# Patient Record
Sex: Female | Born: 1960
Health system: Southern US, Community
[De-identification: ages and names within clinical notes are randomized; demographics above are authoritative.]

## PROBLEM LIST (undated history)

## (undated) HISTORY — PX: BREAST BIOPSY: SHX20

## (undated) HISTORY — PX: AUGMENTATION MAMMAPLASTY: SUR837

---

## 1999-12-25 ENCOUNTER — Other Ambulatory Visit: Admission: RE | Admit: 1999-12-25 | Discharge: 1999-12-25 | Payer: Self-pay | Admitting: Obstetrics and Gynecology

## 1999-12-25 ENCOUNTER — Encounter (INDEPENDENT_AMBULATORY_CARE_PROVIDER_SITE_OTHER): Payer: Self-pay

## 2002-03-03 ENCOUNTER — Other Ambulatory Visit: Admission: RE | Admit: 2002-03-03 | Discharge: 2002-03-03 | Payer: Self-pay | Admitting: Family Medicine

## 2002-10-20 ENCOUNTER — Encounter: Payer: Self-pay | Admitting: Family Medicine

## 2002-10-20 ENCOUNTER — Encounter: Admission: RE | Admit: 2002-10-20 | Discharge: 2002-10-20 | Payer: Self-pay | Admitting: Family Medicine

## 2004-03-15 ENCOUNTER — Encounter: Admission: RE | Admit: 2004-03-15 | Discharge: 2004-03-15 | Payer: Self-pay | Admitting: Family Medicine

## 2004-09-28 ENCOUNTER — Other Ambulatory Visit: Admission: RE | Admit: 2004-09-28 | Discharge: 2004-09-28 | Payer: Self-pay | Admitting: Family Medicine

## 2005-05-15 ENCOUNTER — Encounter: Admission: RE | Admit: 2005-05-15 | Discharge: 2005-05-15 | Payer: Self-pay | Admitting: Family Medicine

## 2006-05-29 ENCOUNTER — Encounter: Admission: RE | Admit: 2006-05-29 | Discharge: 2006-05-29 | Payer: Self-pay | Admitting: Family Medicine

## 2007-09-03 ENCOUNTER — Encounter: Admission: RE | Admit: 2007-09-03 | Discharge: 2007-09-03 | Payer: Self-pay | Admitting: Family Medicine

## 2007-10-16 ENCOUNTER — Encounter: Admission: RE | Admit: 2007-10-16 | Discharge: 2007-10-16 | Payer: Self-pay | Admitting: Family Medicine

## 2008-10-13 ENCOUNTER — Encounter: Admission: RE | Admit: 2008-10-13 | Discharge: 2008-10-13 | Payer: Self-pay | Admitting: Family Medicine

## 2008-11-29 ENCOUNTER — Other Ambulatory Visit: Admission: RE | Admit: 2008-11-29 | Discharge: 2008-11-29 | Payer: Self-pay | Admitting: Family Medicine

## 2009-11-08 ENCOUNTER — Encounter: Admission: RE | Admit: 2009-11-08 | Discharge: 2009-11-08 | Payer: Self-pay | Admitting: Family Medicine

## 2009-12-13 ENCOUNTER — Other Ambulatory Visit: Admission: RE | Admit: 2009-12-13 | Discharge: 2009-12-13 | Payer: Self-pay | Admitting: Family Medicine

## 2010-12-05 ENCOUNTER — Other Ambulatory Visit: Payer: Self-pay | Admitting: Family Medicine

## 2010-12-05 DIAGNOSIS — Z1231 Encounter for screening mammogram for malignant neoplasm of breast: Secondary | ICD-10-CM

## 2010-12-14 ENCOUNTER — Ambulatory Visit
Admission: RE | Admit: 2010-12-14 | Discharge: 2010-12-14 | Disposition: A | Discharge status: Home / Self Care | Payer: BC Managed Care – PPO | Source: Ambulatory Visit | Attending: Family Medicine | Admitting: Family Medicine

## 2010-12-14 DIAGNOSIS — Z1231 Encounter for screening mammogram for malignant neoplasm of breast: Secondary | ICD-10-CM

## 2011-06-20 ENCOUNTER — Other Ambulatory Visit: Payer: Self-pay | Admitting: Family Medicine

## 2011-06-20 DIAGNOSIS — M545 Low back pain, unspecified: Secondary | ICD-10-CM

## 2011-06-22 ENCOUNTER — Ambulatory Visit
Admission: RE | Admit: 2011-06-22 | Discharge: 2011-06-22 | Disposition: A | Discharge status: Home / Self Care | Payer: BC Managed Care – PPO | Source: Ambulatory Visit | Attending: Family Medicine | Admitting: Family Medicine

## 2011-06-22 DIAGNOSIS — M545 Low back pain, unspecified: Secondary | ICD-10-CM

## 2012-04-21 ENCOUNTER — Other Ambulatory Visit: Payer: Self-pay | Admitting: Family Medicine

## 2012-04-21 DIAGNOSIS — Z1231 Encounter for screening mammogram for malignant neoplasm of breast: Secondary | ICD-10-CM

## 2012-04-21 DIAGNOSIS — Z9882 Breast implant status: Secondary | ICD-10-CM

## 2012-05-15 ENCOUNTER — Ambulatory Visit
Admission: RE | Admit: 2012-05-15 | Discharge: 2012-05-15 | Disposition: A | Discharge status: Home / Self Care | Payer: BC Managed Care – PPO | Source: Ambulatory Visit | Attending: Family Medicine | Admitting: Family Medicine

## 2012-05-15 DIAGNOSIS — Z9882 Breast implant status: Secondary | ICD-10-CM

## 2012-05-15 DIAGNOSIS — Z1231 Encounter for screening mammogram for malignant neoplasm of breast: Secondary | ICD-10-CM

## 2012-05-21 ENCOUNTER — Other Ambulatory Visit: Payer: Self-pay | Admitting: Gastroenterology

## 2013-04-01 ENCOUNTER — Other Ambulatory Visit (HOSPITAL_COMMUNITY)
Admission: RE | Admit: 2013-04-01 | Discharge: 2013-04-01 | Disposition: A | Discharge status: Home / Self Care | Payer: BC Managed Care – PPO | Source: Ambulatory Visit | Attending: Family Medicine | Admitting: Family Medicine

## 2013-04-01 ENCOUNTER — Other Ambulatory Visit: Payer: Self-pay | Admitting: Family Medicine

## 2013-04-01 DIAGNOSIS — Z124 Encounter for screening for malignant neoplasm of cervix: Secondary | ICD-10-CM | POA: Insufficient documentation

## 2013-06-15 ENCOUNTER — Other Ambulatory Visit: Payer: Self-pay

## 2013-06-15 DIAGNOSIS — Z9882 Breast implant status: Secondary | ICD-10-CM

## 2013-06-15 DIAGNOSIS — Z1231 Encounter for screening mammogram for malignant neoplasm of breast: Secondary | ICD-10-CM

## 2013-07-09 ENCOUNTER — Ambulatory Visit
Admission: RE | Admit: 2013-07-09 | Discharge: 2013-07-09 | Disposition: A | Discharge status: Home / Self Care | Payer: BC Managed Care – PPO | Source: Ambulatory Visit

## 2013-07-09 DIAGNOSIS — Z9882 Breast implant status: Secondary | ICD-10-CM

## 2013-07-09 DIAGNOSIS — Z1231 Encounter for screening mammogram for malignant neoplasm of breast: Secondary | ICD-10-CM

## 2014-07-26 ENCOUNTER — Other Ambulatory Visit: Payer: Self-pay

## 2014-07-26 DIAGNOSIS — Z1231 Encounter for screening mammogram for malignant neoplasm of breast: Secondary | ICD-10-CM

## 2014-08-03 ENCOUNTER — Other Ambulatory Visit: Payer: Self-pay

## 2014-08-03 ENCOUNTER — Ambulatory Visit
Admission: RE | Admit: 2014-08-03 | Discharge: 2014-08-03 | Disposition: A | Discharge status: Home / Self Care | Payer: BLUE CROSS/BLUE SHIELD | Source: Ambulatory Visit

## 2014-08-03 DIAGNOSIS — Z1231 Encounter for screening mammogram for malignant neoplasm of breast: Secondary | ICD-10-CM

## 2015-10-03 ENCOUNTER — Other Ambulatory Visit: Payer: Self-pay | Admitting: Family Medicine

## 2015-10-03 DIAGNOSIS — Z1231 Encounter for screening mammogram for malignant neoplasm of breast: Secondary | ICD-10-CM

## 2015-10-12 ENCOUNTER — Ambulatory Visit
Admission: RE | Admit: 2015-10-12 | Discharge: 2015-10-12 | Disposition: A | Discharge status: Home / Self Care | Payer: BLUE CROSS/BLUE SHIELD | Source: Ambulatory Visit | Attending: Family Medicine | Admitting: Family Medicine

## 2015-10-12 DIAGNOSIS — Z1231 Encounter for screening mammogram for malignant neoplasm of breast: Secondary | ICD-10-CM | POA: Diagnosis not present

## 2015-11-29 DIAGNOSIS — M549 Dorsalgia, unspecified: Secondary | ICD-10-CM | POA: Diagnosis not present

## 2015-11-29 DIAGNOSIS — Z79899 Other long term (current) drug therapy: Secondary | ICD-10-CM | POA: Diagnosis not present

## 2015-12-21 DIAGNOSIS — M545 Low back pain: Secondary | ICD-10-CM | POA: Diagnosis not present

## 2015-12-21 DIAGNOSIS — M549 Dorsalgia, unspecified: Secondary | ICD-10-CM | POA: Diagnosis not present

## 2015-12-28 DIAGNOSIS — M549 Dorsalgia, unspecified: Secondary | ICD-10-CM | POA: Diagnosis not present

## 2015-12-28 DIAGNOSIS — M545 Low back pain: Secondary | ICD-10-CM | POA: Diagnosis not present

## 2016-01-04 DIAGNOSIS — M549 Dorsalgia, unspecified: Secondary | ICD-10-CM | POA: Diagnosis not present

## 2016-01-04 DIAGNOSIS — M545 Low back pain: Secondary | ICD-10-CM | POA: Diagnosis not present

## 2016-01-06 DIAGNOSIS — M545 Low back pain: Secondary | ICD-10-CM | POA: Diagnosis not present

## 2016-01-06 DIAGNOSIS — M549 Dorsalgia, unspecified: Secondary | ICD-10-CM | POA: Diagnosis not present

## 2016-01-12 DIAGNOSIS — M549 Dorsalgia, unspecified: Secondary | ICD-10-CM | POA: Diagnosis not present

## 2016-01-12 DIAGNOSIS — M545 Low back pain: Secondary | ICD-10-CM | POA: Diagnosis not present

## 2016-01-18 DIAGNOSIS — M549 Dorsalgia, unspecified: Secondary | ICD-10-CM | POA: Diagnosis not present

## 2016-01-18 DIAGNOSIS — M545 Low back pain: Secondary | ICD-10-CM | POA: Diagnosis not present

## 2016-01-20 DIAGNOSIS — M549 Dorsalgia, unspecified: Secondary | ICD-10-CM | POA: Diagnosis not present

## 2016-01-20 DIAGNOSIS — M545 Low back pain: Secondary | ICD-10-CM | POA: Diagnosis not present

## 2016-01-25 DIAGNOSIS — M545 Low back pain: Secondary | ICD-10-CM | POA: Diagnosis not present

## 2016-01-25 DIAGNOSIS — M549 Dorsalgia, unspecified: Secondary | ICD-10-CM | POA: Diagnosis not present

## 2016-01-30 DIAGNOSIS — M549 Dorsalgia, unspecified: Secondary | ICD-10-CM | POA: Diagnosis not present

## 2016-01-30 DIAGNOSIS — M545 Low back pain: Secondary | ICD-10-CM | POA: Diagnosis not present

## 2016-02-03 DIAGNOSIS — M549 Dorsalgia, unspecified: Secondary | ICD-10-CM | POA: Diagnosis not present

## 2016-02-03 DIAGNOSIS — M545 Low back pain: Secondary | ICD-10-CM | POA: Diagnosis not present

## 2016-02-09 DIAGNOSIS — M545 Low back pain: Secondary | ICD-10-CM | POA: Diagnosis not present

## 2016-02-09 DIAGNOSIS — Z23 Encounter for immunization: Secondary | ICD-10-CM | POA: Diagnosis not present

## 2016-02-09 DIAGNOSIS — M549 Dorsalgia, unspecified: Secondary | ICD-10-CM | POA: Diagnosis not present

## 2016-02-22 DIAGNOSIS — M545 Low back pain: Secondary | ICD-10-CM | POA: Diagnosis not present

## 2016-02-22 DIAGNOSIS — M549 Dorsalgia, unspecified: Secondary | ICD-10-CM | POA: Diagnosis not present

## 2016-02-28 DIAGNOSIS — M549 Dorsalgia, unspecified: Secondary | ICD-10-CM | POA: Diagnosis not present

## 2016-02-28 DIAGNOSIS — M545 Low back pain: Secondary | ICD-10-CM | POA: Diagnosis not present

## 2016-03-14 DIAGNOSIS — M549 Dorsalgia, unspecified: Secondary | ICD-10-CM | POA: Diagnosis not present

## 2016-03-14 DIAGNOSIS — M545 Low back pain: Secondary | ICD-10-CM | POA: Diagnosis not present

## 2016-03-21 DIAGNOSIS — M549 Dorsalgia, unspecified: Secondary | ICD-10-CM | POA: Diagnosis not present

## 2016-03-21 DIAGNOSIS — M545 Low back pain: Secondary | ICD-10-CM | POA: Diagnosis not present

## 2016-03-27 DIAGNOSIS — M545 Low back pain: Secondary | ICD-10-CM | POA: Diagnosis not present

## 2016-03-27 DIAGNOSIS — M549 Dorsalgia, unspecified: Secondary | ICD-10-CM | POA: Diagnosis not present

## 2016-04-05 DIAGNOSIS — M545 Low back pain: Secondary | ICD-10-CM | POA: Diagnosis not present

## 2016-04-05 DIAGNOSIS — M549 Dorsalgia, unspecified: Secondary | ICD-10-CM | POA: Diagnosis not present

## 2016-04-25 DIAGNOSIS — M549 Dorsalgia, unspecified: Secondary | ICD-10-CM | POA: Diagnosis not present

## 2016-04-25 DIAGNOSIS — M545 Low back pain: Secondary | ICD-10-CM | POA: Diagnosis not present

## 2016-05-09 DIAGNOSIS — M549 Dorsalgia, unspecified: Secondary | ICD-10-CM | POA: Diagnosis not present

## 2016-05-09 DIAGNOSIS — M545 Low back pain: Secondary | ICD-10-CM | POA: Diagnosis not present

## 2016-05-15 DIAGNOSIS — M549 Dorsalgia, unspecified: Secondary | ICD-10-CM | POA: Diagnosis not present

## 2016-05-15 DIAGNOSIS — M545 Low back pain: Secondary | ICD-10-CM | POA: Diagnosis not present

## 2016-05-22 DIAGNOSIS — M545 Low back pain: Secondary | ICD-10-CM | POA: Diagnosis not present

## 2016-05-22 DIAGNOSIS — M549 Dorsalgia, unspecified: Secondary | ICD-10-CM | POA: Diagnosis not present

## 2016-05-30 DIAGNOSIS — M545 Low back pain: Secondary | ICD-10-CM | POA: Diagnosis not present

## 2016-05-30 DIAGNOSIS — M549 Dorsalgia, unspecified: Secondary | ICD-10-CM | POA: Diagnosis not present

## 2016-06-05 DIAGNOSIS — H52203 Unspecified astigmatism, bilateral: Secondary | ICD-10-CM | POA: Diagnosis not present

## 2016-06-05 DIAGNOSIS — H5213 Myopia, bilateral: Secondary | ICD-10-CM | POA: Diagnosis not present

## 2016-06-05 DIAGNOSIS — H524 Presbyopia: Secondary | ICD-10-CM | POA: Diagnosis not present

## 2016-06-05 DIAGNOSIS — D3131 Benign neoplasm of right choroid: Secondary | ICD-10-CM | POA: Diagnosis not present

## 2016-06-06 DIAGNOSIS — M545 Low back pain: Secondary | ICD-10-CM | POA: Diagnosis not present

## 2016-06-06 DIAGNOSIS — M549 Dorsalgia, unspecified: Secondary | ICD-10-CM | POA: Diagnosis not present

## 2016-06-13 DIAGNOSIS — M549 Dorsalgia, unspecified: Secondary | ICD-10-CM | POA: Diagnosis not present

## 2016-06-13 DIAGNOSIS — M545 Low back pain: Secondary | ICD-10-CM | POA: Diagnosis not present

## 2016-06-27 DIAGNOSIS — M545 Low back pain: Secondary | ICD-10-CM | POA: Diagnosis not present

## 2016-06-27 DIAGNOSIS — M549 Dorsalgia, unspecified: Secondary | ICD-10-CM | POA: Diagnosis not present

## 2016-07-12 ENCOUNTER — Other Ambulatory Visit: Payer: Self-pay | Admitting: Family Medicine

## 2016-07-12 DIAGNOSIS — M5137 Other intervertebral disc degeneration, lumbosacral region: Secondary | ICD-10-CM

## 2016-07-25 DIAGNOSIS — M549 Dorsalgia, unspecified: Secondary | ICD-10-CM | POA: Diagnosis not present

## 2016-07-25 DIAGNOSIS — M545 Low back pain: Secondary | ICD-10-CM | POA: Diagnosis not present

## 2016-07-26 ENCOUNTER — Ambulatory Visit
Admission: RE | Admit: 2016-07-26 | Discharge: 2016-07-26 | Disposition: A | Discharge status: Home / Self Care | Payer: BLUE CROSS/BLUE SHIELD | Source: Ambulatory Visit | Attending: Family Medicine | Admitting: Family Medicine

## 2016-07-26 DIAGNOSIS — M545 Low back pain: Secondary | ICD-10-CM | POA: Diagnosis not present

## 2016-07-26 DIAGNOSIS — M5137 Other intervertebral disc degeneration, lumbosacral region: Secondary | ICD-10-CM

## 2016-07-26 NOTE — Discharge Instructions (Signed)

## 2016-08-07 DIAGNOSIS — M549 Dorsalgia, unspecified: Secondary | ICD-10-CM | POA: Diagnosis not present

## 2016-08-07 DIAGNOSIS — M545 Low back pain: Secondary | ICD-10-CM | POA: Diagnosis not present

## 2016-08-30 ENCOUNTER — Other Ambulatory Visit: Payer: Self-pay | Admitting: Family Medicine

## 2016-08-30 ENCOUNTER — Other Ambulatory Visit (HOSPITAL_COMMUNITY)
Admission: RE | Admit: 2016-08-30 | Discharge: 2016-08-30 | Disposition: A | Discharge status: Home / Self Care | Payer: BLUE CROSS/BLUE SHIELD | Source: Ambulatory Visit | Attending: Family Medicine | Admitting: Family Medicine

## 2016-08-30 DIAGNOSIS — J309 Allergic rhinitis, unspecified: Secondary | ICD-10-CM | POA: Diagnosis not present

## 2016-08-30 DIAGNOSIS — Z124 Encounter for screening for malignant neoplasm of cervix: Secondary | ICD-10-CM | POA: Insufficient documentation

## 2016-08-30 DIAGNOSIS — G43109 Migraine with aura, not intractable, without status migrainosus: Secondary | ICD-10-CM | POA: Diagnosis not present

## 2016-08-30 DIAGNOSIS — Z0000 Encounter for general adult medical examination without abnormal findings: Secondary | ICD-10-CM | POA: Diagnosis not present

## 2016-08-30 DIAGNOSIS — I349 Nonrheumatic mitral valve disorder, unspecified: Secondary | ICD-10-CM | POA: Diagnosis not present

## 2016-09-03 LAB — CYTOLOGY - PAP: Diagnosis: NEGATIVE

## 2016-10-29 ENCOUNTER — Other Ambulatory Visit: Payer: Self-pay | Admitting: Family Medicine

## 2016-10-29 DIAGNOSIS — M549 Dorsalgia, unspecified: Principal | ICD-10-CM

## 2016-10-29 DIAGNOSIS — G8929 Other chronic pain: Secondary | ICD-10-CM

## 2016-11-08 ENCOUNTER — Other Ambulatory Visit: Payer: Self-pay | Admitting: Family Medicine

## 2016-11-08 DIAGNOSIS — Z1231 Encounter for screening mammogram for malignant neoplasm of breast: Secondary | ICD-10-CM

## 2016-11-30 ENCOUNTER — Ambulatory Visit
Admission: RE | Admit: 2016-11-30 | Discharge: 2016-11-30 | Disposition: A | Discharge status: Home / Self Care | Payer: BLUE CROSS/BLUE SHIELD | Source: Ambulatory Visit | Attending: Family Medicine | Admitting: Family Medicine

## 2016-11-30 DIAGNOSIS — Z1231 Encounter for screening mammogram for malignant neoplasm of breast: Secondary | ICD-10-CM

## 2016-12-12 ENCOUNTER — Ambulatory Visit
Admission: RE | Admit: 2016-12-12 | Discharge: 2016-12-12 | Disposition: A | Discharge status: Home / Self Care | Payer: BLUE CROSS/BLUE SHIELD | Source: Ambulatory Visit | Attending: Family Medicine | Admitting: Family Medicine

## 2016-12-12 DIAGNOSIS — M549 Dorsalgia, unspecified: Principal | ICD-10-CM

## 2016-12-12 DIAGNOSIS — G8929 Other chronic pain: Secondary | ICD-10-CM

## 2016-12-12 DIAGNOSIS — M545 Low back pain: Secondary | ICD-10-CM | POA: Diagnosis not present

## 2016-12-12 MED ORDER — IOPAMIDOL (ISOVUE-M 200) INJECTION 41%
1.0000 mL | Freq: Once | INTRAMUSCULAR | Status: AC
  Administered 2016-12-12: 1 mL via EPIDURAL

## 2016-12-12 MED ORDER — METHYLPREDNISOLONE ACETATE 40 MG/ML INJ SUSP (RADIOLOG
120.0000 mg | Freq: Once | INTRAMUSCULAR | Status: AC
  Administered 2016-12-12: 120 mg via EPIDURAL

## 2016-12-12 NOTE — Discharge Instructions (Signed)

## 2017-01-22 DIAGNOSIS — Z23 Encounter for immunization: Secondary | ICD-10-CM | POA: Diagnosis not present

## 2017-05-30 DIAGNOSIS — J329 Chronic sinusitis, unspecified: Secondary | ICD-10-CM | POA: Diagnosis not present

## 2017-06-05 DIAGNOSIS — H52203 Unspecified astigmatism, bilateral: Secondary | ICD-10-CM | POA: Diagnosis not present

## 2017-06-05 DIAGNOSIS — H524 Presbyopia: Secondary | ICD-10-CM | POA: Diagnosis not present

## 2017-06-05 DIAGNOSIS — H5213 Myopia, bilateral: Secondary | ICD-10-CM | POA: Diagnosis not present

## 2017-06-05 DIAGNOSIS — D3131 Benign neoplasm of right choroid: Secondary | ICD-10-CM | POA: Diagnosis not present

## 2017-09-02 DIAGNOSIS — Z0000 Encounter for general adult medical examination without abnormal findings: Secondary | ICD-10-CM | POA: Diagnosis not present

## 2017-09-02 DIAGNOSIS — Z131 Encounter for screening for diabetes mellitus: Secondary | ICD-10-CM | POA: Diagnosis not present

## 2017-09-02 DIAGNOSIS — Z136 Encounter for screening for cardiovascular disorders: Secondary | ICD-10-CM | POA: Diagnosis not present

## 2017-12-06 ENCOUNTER — Other Ambulatory Visit: Payer: Self-pay | Admitting: Family Medicine

## 2017-12-06 DIAGNOSIS — Z1231 Encounter for screening mammogram for malignant neoplasm of breast: Secondary | ICD-10-CM

## 2017-12-18 ENCOUNTER — Other Ambulatory Visit: Payer: Self-pay | Admitting: Family Medicine

## 2017-12-18 DIAGNOSIS — M5137 Other intervertebral disc degeneration, lumbosacral region: Secondary | ICD-10-CM

## 2017-12-31 ENCOUNTER — Ambulatory Visit
Admission: RE | Admit: 2017-12-31 | Discharge: 2017-12-31 | Disposition: A | Discharge status: Home / Self Care | Payer: BLUE CROSS/BLUE SHIELD | Source: Ambulatory Visit | Attending: Family Medicine | Admitting: Family Medicine

## 2017-12-31 DIAGNOSIS — Z1231 Encounter for screening mammogram for malignant neoplasm of breast: Secondary | ICD-10-CM | POA: Diagnosis not present

## 2018-01-01 ENCOUNTER — Other Ambulatory Visit: Payer: BLUE CROSS/BLUE SHIELD

## 2018-01-15 ENCOUNTER — Ambulatory Visit
Admission: RE | Admit: 2018-01-15 | Discharge: 2018-01-15 | Disposition: A | Discharge status: Home / Self Care | Payer: BLUE CROSS/BLUE SHIELD | Source: Ambulatory Visit | Attending: Family Medicine | Admitting: Family Medicine

## 2018-01-15 DIAGNOSIS — M5137 Other intervertebral disc degeneration, lumbosacral region: Secondary | ICD-10-CM

## 2018-01-15 DIAGNOSIS — M545 Low back pain: Secondary | ICD-10-CM | POA: Diagnosis not present

## 2018-01-15 MED ORDER — IOPAMIDOL (ISOVUE-M 200) INJECTION 41%
1.0000 mL | Freq: Once | INTRAMUSCULAR | Status: AC
  Administered 2018-01-15: 1 mL via EPIDURAL

## 2018-01-15 MED ORDER — METHYLPREDNISOLONE ACETATE 40 MG/ML INJ SUSP (RADIOLOG
120.0000 mg | Freq: Once | INTRAMUSCULAR | Status: AC
  Administered 2018-01-15: 120 mg via EPIDURAL

## 2018-01-15 NOTE — Discharge Instructions (Signed)

## 2018-01-28 DIAGNOSIS — M654 Radial styloid tenosynovitis [de Quervain]: Secondary | ICD-10-CM | POA: Diagnosis not present

## 2018-01-28 DIAGNOSIS — M25841 Other specified joint disorders, right hand: Secondary | ICD-10-CM | POA: Diagnosis not present

## 2018-02-13 DIAGNOSIS — Z23 Encounter for immunization: Secondary | ICD-10-CM | POA: Diagnosis not present

## 2018-04-30 DIAGNOSIS — R2231 Localized swelling, mass and lump, right upper limb: Secondary | ICD-10-CM | POA: Diagnosis not present

## 2018-05-28 ENCOUNTER — Other Ambulatory Visit: Payer: Self-pay | Admitting: General Surgery

## 2018-05-28 DIAGNOSIS — L905 Scar conditions and fibrosis of skin: Secondary | ICD-10-CM | POA: Diagnosis not present

## 2018-05-28 DIAGNOSIS — D2111 Benign neoplasm of connective and other soft tissue of right upper limb, including shoulder: Secondary | ICD-10-CM | POA: Diagnosis not present

## 2018-06-17 DIAGNOSIS — D3131 Benign neoplasm of right choroid: Secondary | ICD-10-CM | POA: Diagnosis not present

## 2018-06-17 DIAGNOSIS — H5213 Myopia, bilateral: Secondary | ICD-10-CM | POA: Diagnosis not present

## 2018-06-17 DIAGNOSIS — H524 Presbyopia: Secondary | ICD-10-CM | POA: Diagnosis not present

## 2018-06-17 DIAGNOSIS — H52203 Unspecified astigmatism, bilateral: Secondary | ICD-10-CM | POA: Diagnosis not present

## 2018-06-27 ENCOUNTER — Other Ambulatory Visit: Payer: Self-pay | Admitting: Family Medicine

## 2018-06-27 DIAGNOSIS — M5137 Other intervertebral disc degeneration, lumbosacral region: Secondary | ICD-10-CM

## 2018-07-22 ENCOUNTER — Other Ambulatory Visit: Payer: BLUE CROSS/BLUE SHIELD

## 2018-09-04 ENCOUNTER — Ambulatory Visit
Admission: RE | Admit: 2018-09-04 | Discharge: 2018-09-04 | Disposition: A | Discharge status: Home / Self Care | Payer: BLUE CROSS/BLUE SHIELD | Source: Ambulatory Visit | Attending: Family Medicine | Admitting: Family Medicine

## 2018-09-04 ENCOUNTER — Other Ambulatory Visit: Payer: Self-pay

## 2018-09-04 DIAGNOSIS — M5137 Other intervertebral disc degeneration, lumbosacral region: Secondary | ICD-10-CM

## 2018-09-04 DIAGNOSIS — M4726 Other spondylosis with radiculopathy, lumbar region: Secondary | ICD-10-CM | POA: Diagnosis not present

## 2018-09-04 MED ORDER — METHYLPREDNISOLONE ACETATE 40 MG/ML INJ SUSP (RADIOLOG
120.0000 mg | Freq: Once | INTRAMUSCULAR | Status: AC
  Administered 2018-09-04: 16:00:00 120 mg via EPIDURAL

## 2018-09-04 MED ORDER — IOPAMIDOL (ISOVUE-M 200) INJECTION 41%
1.0000 mL | Freq: Once | INTRAMUSCULAR | Status: AC
  Administered 2018-09-04: 16:00:00 1 mL via EPIDURAL

## 2018-09-04 NOTE — Discharge Instructions (Signed)

## 2018-09-23 DIAGNOSIS — Z23 Encounter for immunization: Secondary | ICD-10-CM | POA: Diagnosis not present

## 2018-09-23 DIAGNOSIS — Z13220 Encounter for screening for lipoid disorders: Secondary | ICD-10-CM | POA: Diagnosis not present

## 2018-09-23 DIAGNOSIS — Z1159 Encounter for screening for other viral diseases: Secondary | ICD-10-CM | POA: Diagnosis not present

## 2018-09-23 DIAGNOSIS — Z0000 Encounter for general adult medical examination without abnormal findings: Secondary | ICD-10-CM | POA: Diagnosis not present

## 2018-12-23 DIAGNOSIS — D229 Melanocytic nevi, unspecified: Secondary | ICD-10-CM | POA: Diagnosis not present

## 2018-12-23 DIAGNOSIS — Z23 Encounter for immunization: Secondary | ICD-10-CM | POA: Diagnosis not present

## 2019-01-09 ENCOUNTER — Other Ambulatory Visit: Payer: Self-pay | Admitting: Family Medicine

## 2019-01-09 DIAGNOSIS — Z1231 Encounter for screening mammogram for malignant neoplasm of breast: Secondary | ICD-10-CM

## 2019-02-25 ENCOUNTER — Ambulatory Visit
Admission: RE | Admit: 2019-02-25 | Discharge: 2019-02-25 | Disposition: A | Discharge status: Home / Self Care | Payer: BC Managed Care – PPO | Source: Ambulatory Visit | Attending: Family Medicine | Admitting: Family Medicine

## 2019-02-25 ENCOUNTER — Other Ambulatory Visit: Payer: Self-pay

## 2019-02-25 DIAGNOSIS — Z1231 Encounter for screening mammogram for malignant neoplasm of breast: Secondary | ICD-10-CM

## 2019-06-23 DIAGNOSIS — D3131 Benign neoplasm of right choroid: Secondary | ICD-10-CM | POA: Diagnosis not present

## 2019-06-23 DIAGNOSIS — H5213 Myopia, bilateral: Secondary | ICD-10-CM | POA: Diagnosis not present

## 2019-06-23 DIAGNOSIS — H524 Presbyopia: Secondary | ICD-10-CM | POA: Diagnosis not present

## 2019-06-23 DIAGNOSIS — H52203 Unspecified astigmatism, bilateral: Secondary | ICD-10-CM | POA: Diagnosis not present

## 2019-10-07 ENCOUNTER — Other Ambulatory Visit (HOSPITAL_COMMUNITY)
Admission: RE | Admit: 2019-10-07 | Discharge: 2019-10-07 | Disposition: A | Discharge status: Home / Self Care | Payer: BC Managed Care – PPO | Source: Ambulatory Visit | Attending: Family Medicine | Admitting: Family Medicine

## 2019-10-07 ENCOUNTER — Other Ambulatory Visit: Payer: Self-pay | Admitting: Family Medicine

## 2019-10-07 DIAGNOSIS — Z5181 Encounter for therapeutic drug level monitoring: Secondary | ICD-10-CM | POA: Diagnosis not present

## 2019-10-07 DIAGNOSIS — Z13220 Encounter for screening for lipoid disorders: Secondary | ICD-10-CM | POA: Diagnosis not present

## 2019-10-07 DIAGNOSIS — Z124 Encounter for screening for malignant neoplasm of cervix: Secondary | ICD-10-CM | POA: Diagnosis not present

## 2019-10-07 DIAGNOSIS — Z0000 Encounter for general adult medical examination without abnormal findings: Secondary | ICD-10-CM | POA: Diagnosis not present

## 2019-10-08 LAB — CYTOLOGY - PAP: Diagnosis: NEGATIVE

## 2020-02-17 ENCOUNTER — Other Ambulatory Visit: Payer: Self-pay | Admitting: Family Medicine

## 2020-02-17 DIAGNOSIS — Z1231 Encounter for screening mammogram for malignant neoplasm of breast: Secondary | ICD-10-CM

## 2020-03-29 ENCOUNTER — Other Ambulatory Visit: Payer: Self-pay

## 2020-03-29 ENCOUNTER — Ambulatory Visit: Payer: BC Managed Care – PPO | Attending: Internal Medicine

## 2020-03-29 ENCOUNTER — Ambulatory Visit
Admission: RE | Admit: 2020-03-29 | Discharge: 2020-03-29 | Disposition: A | Discharge status: Home / Self Care | Payer: BC Managed Care – PPO | Source: Ambulatory Visit | Attending: Family Medicine | Admitting: Family Medicine

## 2020-03-29 ENCOUNTER — Ambulatory Visit: Payer: BC Managed Care – PPO

## 2020-03-29 DIAGNOSIS — Z23 Encounter for immunization: Secondary | ICD-10-CM

## 2020-03-29 DIAGNOSIS — Z1231 Encounter for screening mammogram for malignant neoplasm of breast: Secondary | ICD-10-CM

## 2020-03-29 NOTE — Progress Notes (Signed)
° °  Covid-19 Vaccination Clinic  Name:  Nicole Gibson    MRN: [id number] DOB: 1961/01/12  03/29/2020  [name] was observed post Covid-19 immunization for 15 minutes without incident. She was provided with Vaccine Information Sheet and instruction to access the V-Safe system.   [name] was instructed to call 911 with any severe reactions post vaccine:  Difficulty breathing   Swelling of face and throat   A fast heartbeat   A bad rash all over body   Dizziness and weakness   Immunizations Administered    Name Date Dose VIS Date Route   Pfizer COVID-19 Vaccine 03/29/2020  4:05 PM 0.3 mL 02/03/2020 Intramuscular   Manufacturer: [employer]   Lot: [id number]   NDC: [id number]

## 2020-06-28 DIAGNOSIS — H524 Presbyopia: Secondary | ICD-10-CM | POA: Diagnosis not present

## 2020-06-28 DIAGNOSIS — H5213 Myopia, bilateral: Secondary | ICD-10-CM | POA: Diagnosis not present

## 2020-06-28 DIAGNOSIS — H52203 Unspecified astigmatism, bilateral: Secondary | ICD-10-CM | POA: Diagnosis not present

## 2020-06-28 DIAGNOSIS — D3131 Benign neoplasm of right choroid: Secondary | ICD-10-CM | POA: Diagnosis not present

## 2020-10-18 DIAGNOSIS — Z131 Encounter for screening for diabetes mellitus: Secondary | ICD-10-CM | POA: Diagnosis not present

## 2020-10-18 DIAGNOSIS — Z0000 Encounter for general adult medical examination without abnormal findings: Secondary | ICD-10-CM | POA: Diagnosis not present

## 2020-10-18 DIAGNOSIS — Z13220 Encounter for screening for lipoid disorders: Secondary | ICD-10-CM | POA: Diagnosis not present

## 2020-12-02 DIAGNOSIS — L820 Inflamed seborrheic keratosis: Secondary | ICD-10-CM | POA: Diagnosis not present

## 2021-01-28 DIAGNOSIS — Z23 Encounter for immunization: Secondary | ICD-10-CM | POA: Diagnosis not present

## 2021-02-24 ENCOUNTER — Other Ambulatory Visit: Payer: Self-pay | Admitting: Family Medicine

## 2021-02-24 DIAGNOSIS — Z1231 Encounter for screening mammogram for malignant neoplasm of breast: Secondary | ICD-10-CM

## 2021-03-31 ENCOUNTER — Ambulatory Visit
Admission: RE | Admit: 2021-03-31 | Discharge: 2021-03-31 | Disposition: A | Discharge status: Home / Self Care | Payer: BC Managed Care – PPO | Source: Ambulatory Visit | Attending: Family Medicine | Admitting: Family Medicine

## 2021-03-31 DIAGNOSIS — Z1231 Encounter for screening mammogram for malignant neoplasm of breast: Secondary | ICD-10-CM

## 2021-06-30 DIAGNOSIS — L718 Other rosacea: Secondary | ICD-10-CM | POA: Diagnosis not present

## 2021-07-04 DIAGNOSIS — H52203 Unspecified astigmatism, bilateral: Secondary | ICD-10-CM | POA: Diagnosis not present

## 2021-07-04 DIAGNOSIS — H524 Presbyopia: Secondary | ICD-10-CM | POA: Diagnosis not present

## 2021-07-04 DIAGNOSIS — H5213 Myopia, bilateral: Secondary | ICD-10-CM | POA: Diagnosis not present

## 2021-07-04 DIAGNOSIS — D3131 Benign neoplasm of right choroid: Secondary | ICD-10-CM | POA: Diagnosis not present

## 2021-11-27 ENCOUNTER — Other Ambulatory Visit: Payer: Self-pay | Admitting: Family Medicine

## 2021-11-27 DIAGNOSIS — Z0000 Encounter for general adult medical examination without abnormal findings: Secondary | ICD-10-CM | POA: Diagnosis not present

## 2021-11-27 DIAGNOSIS — Z13220 Encounter for screening for lipoid disorders: Secondary | ICD-10-CM | POA: Diagnosis not present

## 2021-11-27 DIAGNOSIS — E2839 Other primary ovarian failure: Secondary | ICD-10-CM

## 2021-11-27 DIAGNOSIS — Z1231 Encounter for screening mammogram for malignant neoplasm of breast: Secondary | ICD-10-CM

## 2021-11-27 DIAGNOSIS — Z5181 Encounter for therapeutic drug level monitoring: Secondary | ICD-10-CM | POA: Diagnosis not present

## 2022-01-15 DIAGNOSIS — Z23 Encounter for immunization: Secondary | ICD-10-CM | POA: Diagnosis not present

## 2022-02-08 DIAGNOSIS — Z23 Encounter for immunization: Secondary | ICD-10-CM | POA: Diagnosis not present

## 2022-04-12 DIAGNOSIS — Z23 Encounter for immunization: Secondary | ICD-10-CM | POA: Diagnosis not present

## 2022-05-16 ENCOUNTER — Ambulatory Visit
Admission: RE | Admit: 2022-05-16 | Discharge: 2022-05-16 | Disposition: A | Discharge status: Home / Self Care | Payer: BC Managed Care – PPO | Source: Ambulatory Visit | Attending: Family Medicine | Admitting: Family Medicine

## 2022-05-16 DIAGNOSIS — M85852 Other specified disorders of bone density and structure, left thigh: Secondary | ICD-10-CM | POA: Diagnosis not present

## 2022-05-16 DIAGNOSIS — E2839 Other primary ovarian failure: Secondary | ICD-10-CM

## 2022-05-16 DIAGNOSIS — Z1231 Encounter for screening mammogram for malignant neoplasm of breast: Secondary | ICD-10-CM

## 2022-05-16 DIAGNOSIS — Z780 Asymptomatic menopausal state: Secondary | ICD-10-CM | POA: Diagnosis not present

## 2022-11-01 ENCOUNTER — Other Ambulatory Visit: Payer: Self-pay | Admitting: Internal Medicine

## 2022-11-01 ENCOUNTER — Ambulatory Visit
Admission: RE | Admit: 2022-11-01 | Discharge: 2022-11-01 | Disposition: A | Discharge status: Home / Self Care | Payer: BC Managed Care – PPO | Source: Ambulatory Visit | Attending: Internal Medicine | Admitting: Internal Medicine

## 2022-11-01 DIAGNOSIS — M5126 Other intervertebral disc displacement, lumbar region: Secondary | ICD-10-CM | POA: Diagnosis not present

## 2022-11-01 DIAGNOSIS — M5136 Other intervertebral disc degeneration, lumbar region: Secondary | ICD-10-CM | POA: Diagnosis not present

## 2022-11-01 DIAGNOSIS — M5137 Other intervertebral disc degeneration, lumbosacral region: Secondary | ICD-10-CM | POA: Diagnosis not present

## 2022-11-01 DIAGNOSIS — G8929 Other chronic pain: Secondary | ICD-10-CM | POA: Diagnosis not present

## 2022-11-14 DIAGNOSIS — M5126 Other intervertebral disc displacement, lumbar region: Secondary | ICD-10-CM | POA: Diagnosis not present

## 2022-11-14 DIAGNOSIS — M549 Dorsalgia, unspecified: Secondary | ICD-10-CM | POA: Diagnosis not present

## 2022-11-28 DIAGNOSIS — M5126 Other intervertebral disc displacement, lumbar region: Secondary | ICD-10-CM | POA: Diagnosis not present

## 2022-11-28 DIAGNOSIS — M549 Dorsalgia, unspecified: Secondary | ICD-10-CM | POA: Diagnosis not present

## 2022-12-05 DIAGNOSIS — Z1211 Encounter for screening for malignant neoplasm of colon: Secondary | ICD-10-CM | POA: Diagnosis not present

## 2022-12-05 DIAGNOSIS — K5730 Diverticulosis of large intestine without perforation or abscess without bleeding: Secondary | ICD-10-CM | POA: Diagnosis not present

## 2022-12-05 DIAGNOSIS — K649 Unspecified hemorrhoids: Secondary | ICD-10-CM | POA: Diagnosis not present

## 2022-12-06 DIAGNOSIS — Z124 Encounter for screening for malignant neoplasm of cervix: Secondary | ICD-10-CM | POA: Diagnosis not present

## 2022-12-06 DIAGNOSIS — E7800 Pure hypercholesterolemia, unspecified: Secondary | ICD-10-CM | POA: Diagnosis not present

## 2022-12-06 DIAGNOSIS — Z0000 Encounter for general adult medical examination without abnormal findings: Secondary | ICD-10-CM | POA: Diagnosis not present

## 2023-02-27 ENCOUNTER — Other Ambulatory Visit (HOSPITAL_COMMUNITY): Payer: Self-pay

## 2023-02-27 MED ORDER — COVID-19 MRNA VAC-TRIS(PFIZER) 30 MCG/0.3ML IM SUSY
0.3000 mL | PREFILLED_SYRINGE | Freq: Once | INTRAMUSCULAR | 0 refills | Status: AC
  Filled 2023-02-27: qty 0.3, 1d supply, fill #0

## 2023-04-04 ENCOUNTER — Other Ambulatory Visit: Payer: Self-pay | Admitting: Internal Medicine

## 2023-04-04 DIAGNOSIS — Z0000 Encounter for general adult medical examination without abnormal findings: Secondary | ICD-10-CM

## 2023-05-21 ENCOUNTER — Ambulatory Visit
Admission: RE | Admit: 2023-05-21 | Discharge: 2023-05-21 | Disposition: A | Discharge status: Home / Self Care | Payer: BC Managed Care – PPO | Source: Ambulatory Visit | Attending: Internal Medicine | Admitting: Internal Medicine

## 2023-05-21 DIAGNOSIS — Z0000 Encounter for general adult medical examination without abnormal findings: Secondary | ICD-10-CM

## 2023-05-21 DIAGNOSIS — Z1231 Encounter for screening mammogram for malignant neoplasm of breast: Secondary | ICD-10-CM | POA: Diagnosis not present

## 2023-05-24 ENCOUNTER — Other Ambulatory Visit: Payer: Self-pay | Admitting: Internal Medicine

## 2023-05-24 DIAGNOSIS — R928 Other abnormal and inconclusive findings on diagnostic imaging of breast: Secondary | ICD-10-CM

## 2023-05-30 ENCOUNTER — Encounter: Payer: Self-pay | Admitting: Internal Medicine

## 2023-06-06 ENCOUNTER — Other Ambulatory Visit: Payer: Self-pay | Admitting: Internal Medicine

## 2023-06-06 ENCOUNTER — Ambulatory Visit
Admission: RE | Admit: 2023-06-06 | Discharge: 2023-06-06 | Disposition: A | Discharge status: Home / Self Care | Payer: BC Managed Care – PPO | Source: Ambulatory Visit | Attending: Internal Medicine | Admitting: Internal Medicine

## 2023-06-06 ENCOUNTER — Ambulatory Visit
Admission: RE | Admit: 2023-06-06 | Discharge: 2023-06-06 | Disposition: A | Discharge status: Home / Self Care | Payer: BC Managed Care – PPO | Source: Ambulatory Visit | Attending: Internal Medicine

## 2023-06-06 DIAGNOSIS — R928 Other abnormal and inconclusive findings on diagnostic imaging of breast: Secondary | ICD-10-CM

## 2023-06-06 DIAGNOSIS — N6489 Other specified disorders of breast: Secondary | ICD-10-CM

## 2023-07-09 DIAGNOSIS — D3131 Benign neoplasm of right choroid: Secondary | ICD-10-CM | POA: Diagnosis not present

## 2023-07-09 DIAGNOSIS — H2513 Age-related nuclear cataract, bilateral: Secondary | ICD-10-CM | POA: Diagnosis not present

## 2023-07-09 DIAGNOSIS — H5213 Myopia, bilateral: Secondary | ICD-10-CM | POA: Diagnosis not present

## 2023-10-31 IMAGING — MG DIGITAL SCREENING BREAST BILAT IMPLANT W/ TOMO W/ CAD
9 of 12 series · 9 of 28 positions shown · non-contrast
Comparison: Previous exam(s).

CLINICAL DATA: Screening.

EXAM:
DIGITAL SCREENING BILATERAL MAMMOGRAM WITH IMPLANTS, CAD AND
TOMOSYNTHESIS
TECHNIQUE: Bilateral screening digital craniocaudal and mediolateral oblique
mammograms were obtained. Bilateral screening digital breast
tomosynthesis was performed. The images were evaluated with
computer-aided detection. Standard and/or implant displaced views
were performed.

[R MLO]
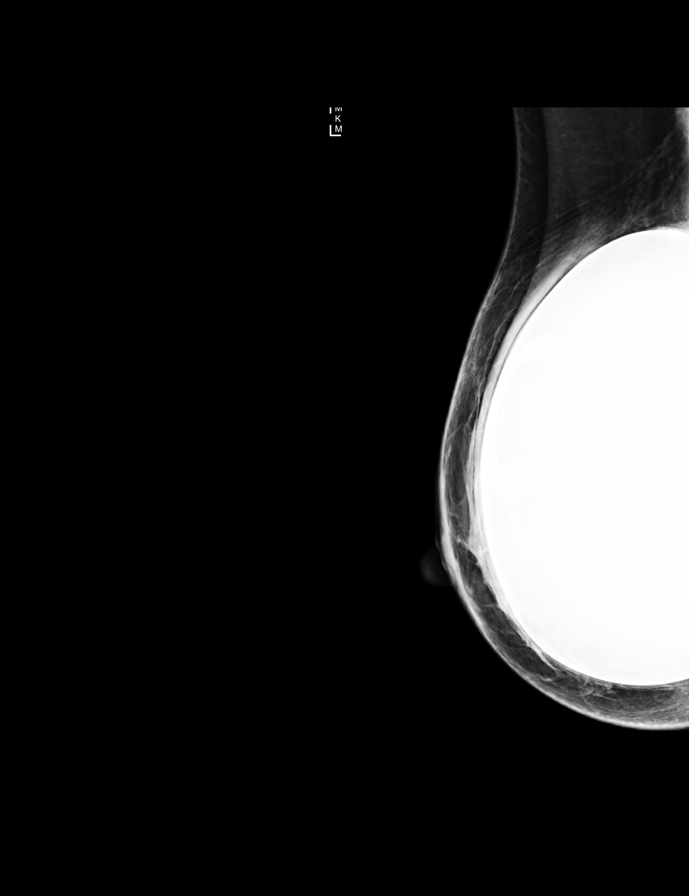

[R CC]
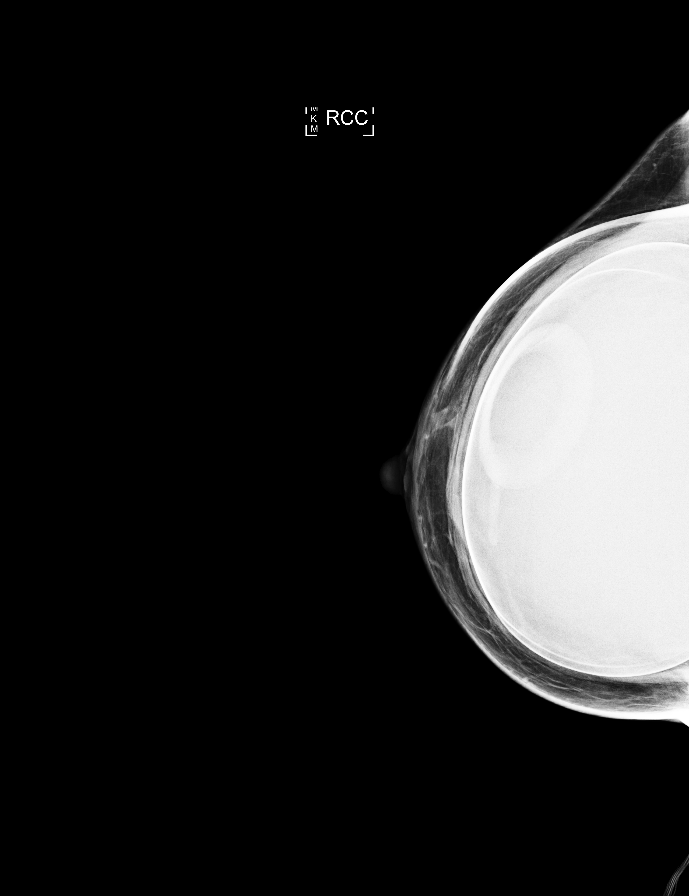

[L CC]
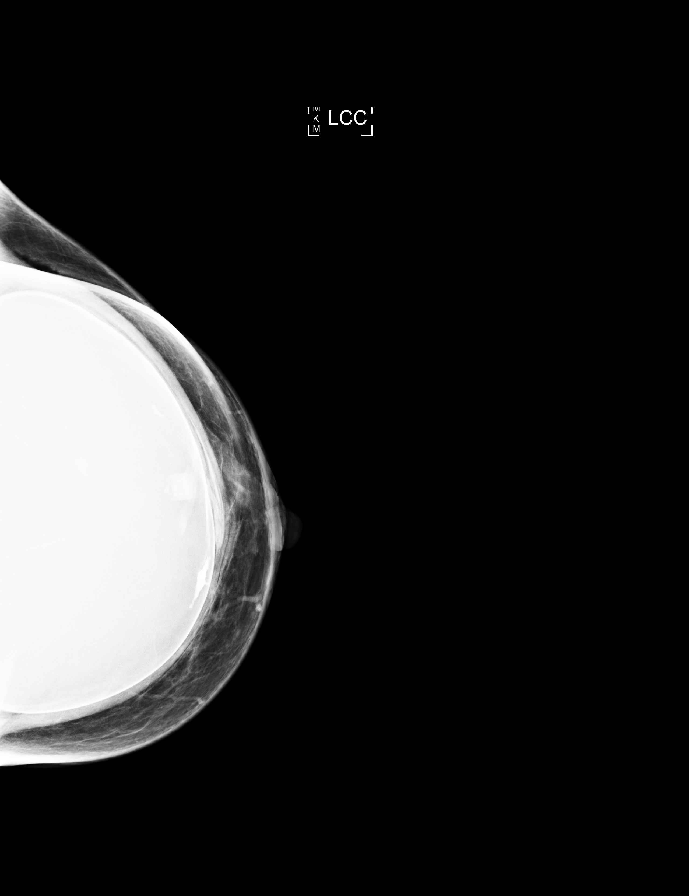

[L MLO]
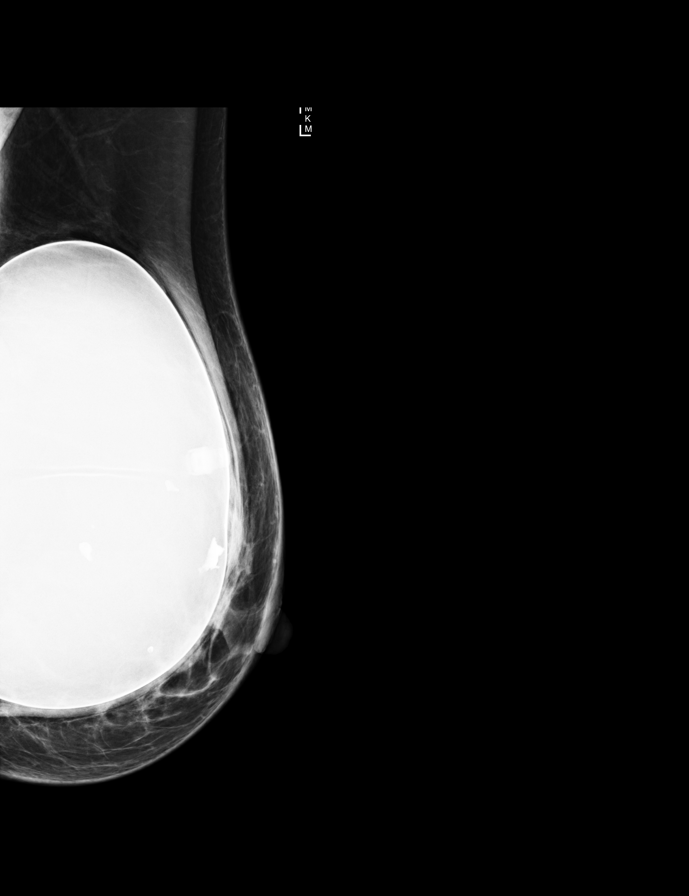

[L CC synth-2D]
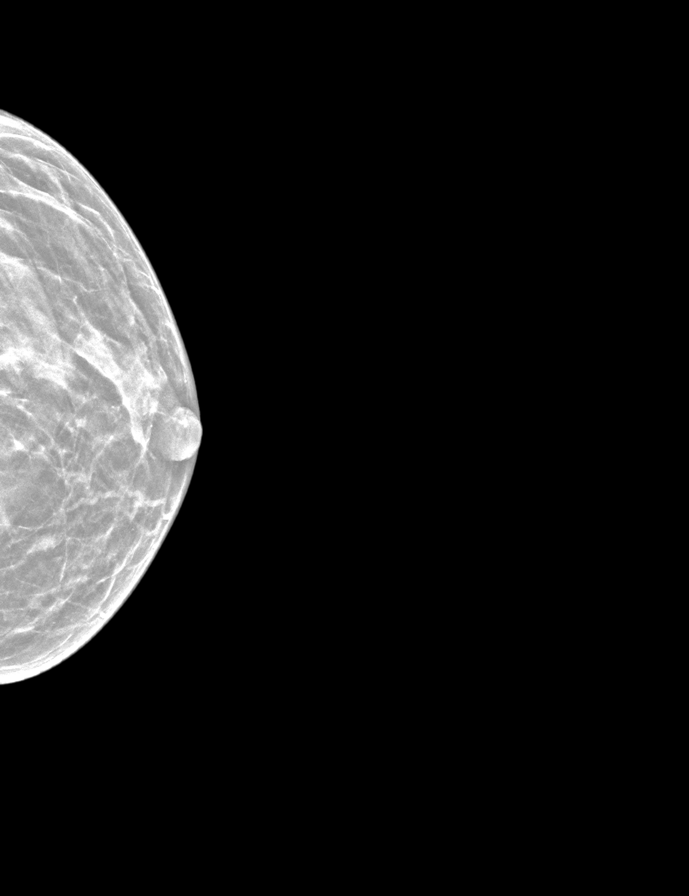

[L MLO synth-2D]
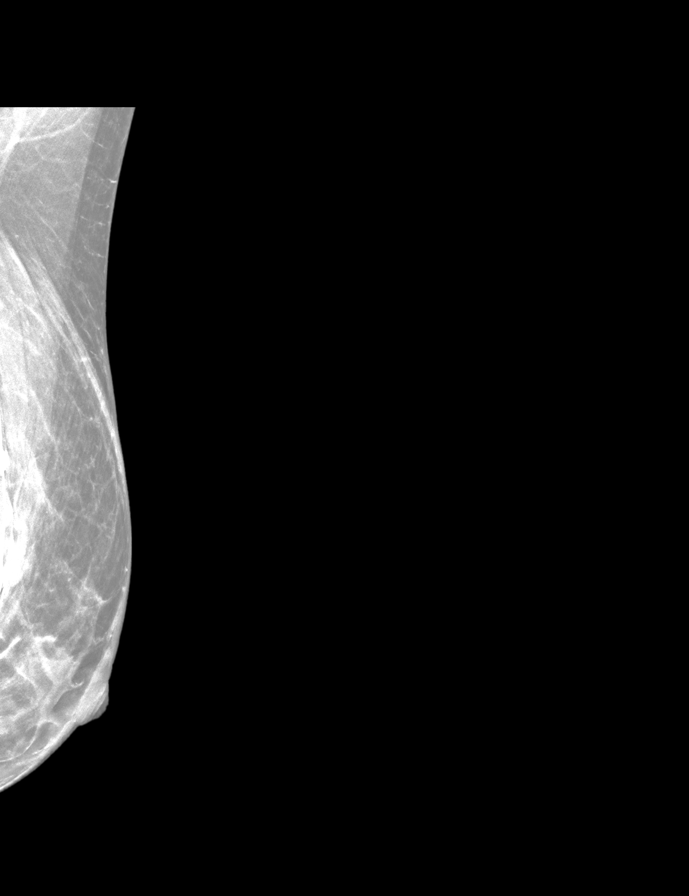

[R CC synth-2D]
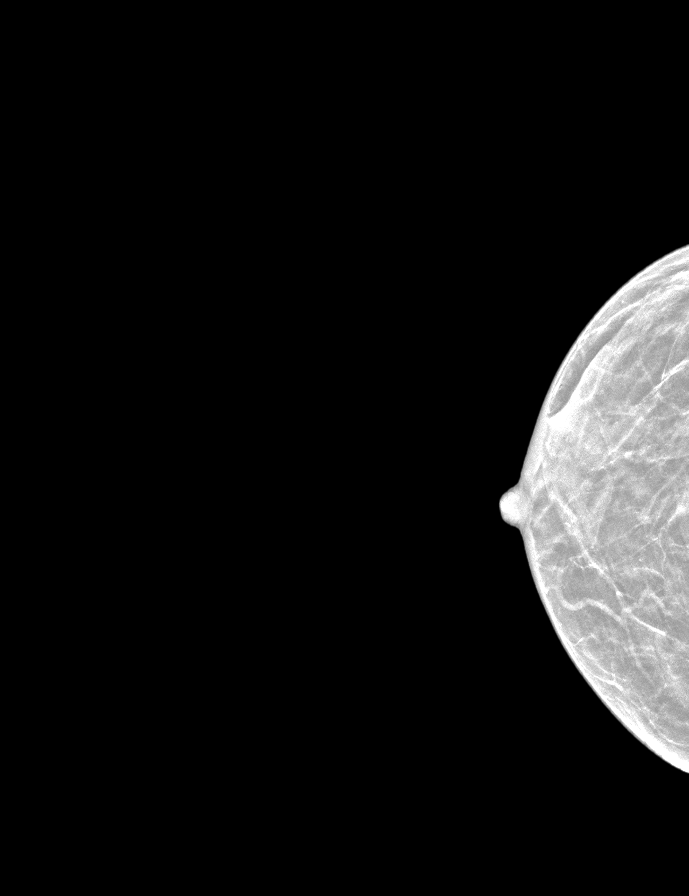

[R MLO synth-2D]
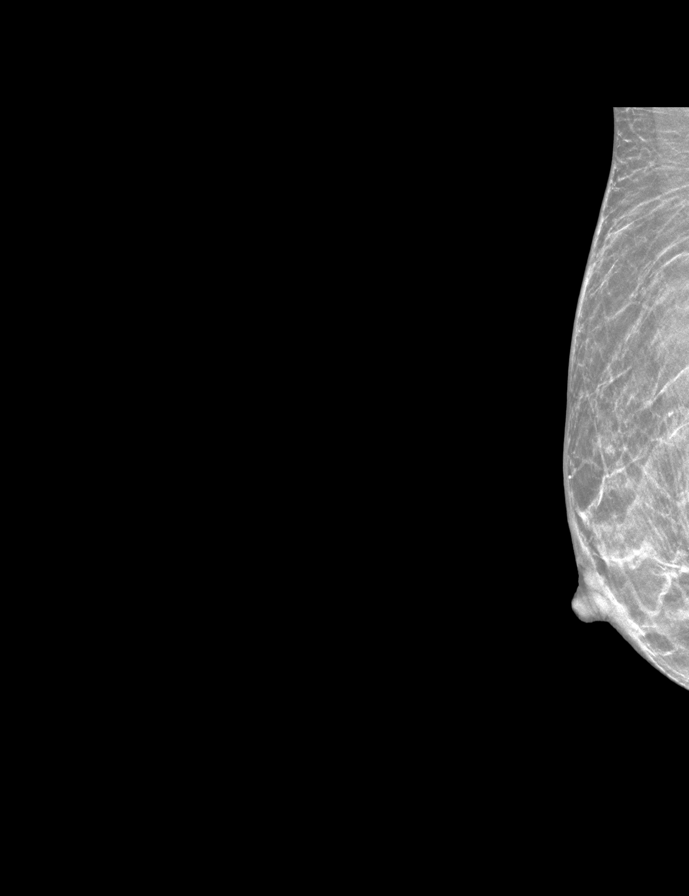

[R MLOID BREAST TOMOSYNTHESIS IMAGE tomo · tomo slice 15/30.0]
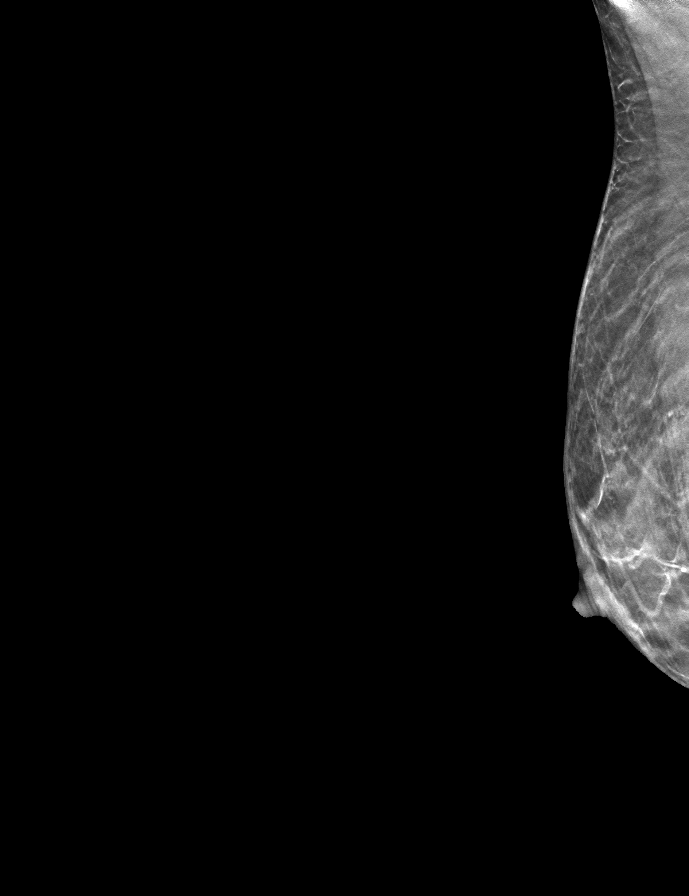

[9 of 28 positions shown; findings below may reference images not displayed]

ACR Breast Density Category c: The breast tissue is heterogeneously
dense, which may obscure small masses.
FINDINGS: The patient has retropectoral implants. There are no findings
suspicious for malignancy.
IMPRESSION: No mammographic evidence of malignancy. A result letter of this
screening mammogram will be mailed directly to the patient.

RECOMMENDATION:
Screening mammogram in one year. (Code:LT-E-7TH)

BI-RADS CATEGORY  1:  Negative.

## 2023-12-05 ENCOUNTER — Other Ambulatory Visit: Payer: BC Managed Care – PPO

## 2023-12-09 DIAGNOSIS — L719 Rosacea, unspecified: Secondary | ICD-10-CM | POA: Diagnosis not present

## 2023-12-09 DIAGNOSIS — Z23 Encounter for immunization: Secondary | ICD-10-CM | POA: Diagnosis not present

## 2023-12-09 DIAGNOSIS — G43109 Migraine with aura, not intractable, without status migrainosus: Secondary | ICD-10-CM | POA: Diagnosis not present

## 2023-12-09 DIAGNOSIS — M85852 Other specified disorders of bone density and structure, left thigh: Secondary | ICD-10-CM | POA: Diagnosis not present

## 2023-12-09 DIAGNOSIS — E7800 Pure hypercholesterolemia, unspecified: Secondary | ICD-10-CM | POA: Diagnosis not present

## 2023-12-09 DIAGNOSIS — I349 Nonrheumatic mitral valve disorder, unspecified: Secondary | ICD-10-CM | POA: Diagnosis not present

## 2023-12-09 DIAGNOSIS — Z0000 Encounter for general adult medical examination without abnormal findings: Secondary | ICD-10-CM | POA: Diagnosis not present

## 2023-12-09 DIAGNOSIS — N951 Menopausal and female climacteric states: Secondary | ICD-10-CM | POA: Diagnosis not present

## 2023-12-10 ENCOUNTER — Ambulatory Visit
Admission: RE | Admit: 2023-12-10 | Discharge: 2023-12-10 | Disposition: A | Discharge status: Home / Self Care | Source: Ambulatory Visit | Attending: Internal Medicine | Admitting: Internal Medicine

## 2023-12-10 ENCOUNTER — Ambulatory Visit: Admission: RE | Admit: 2023-12-10 | Source: Ambulatory Visit

## 2023-12-10 DIAGNOSIS — R928 Other abnormal and inconclusive findings on diagnostic imaging of breast: Secondary | ICD-10-CM | POA: Diagnosis not present

## 2023-12-10 DIAGNOSIS — N6489 Other specified disorders of breast: Secondary | ICD-10-CM

## 2024-01-17 DIAGNOSIS — M25561 Pain in right knee: Secondary | ICD-10-CM | POA: Diagnosis not present

## 2024-01-17 DIAGNOSIS — Z23 Encounter for immunization: Secondary | ICD-10-CM | POA: Diagnosis not present

## 2024-01-31 DIAGNOSIS — M2242 Chondromalacia patellae, left knee: Secondary | ICD-10-CM | POA: Diagnosis not present

## 2024-01-31 DIAGNOSIS — M2241 Chondromalacia patellae, right knee: Secondary | ICD-10-CM | POA: Diagnosis not present

## 2024-02-03 DIAGNOSIS — M2241 Chondromalacia patellae, right knee: Secondary | ICD-10-CM | POA: Diagnosis not present

## 2024-02-17 DIAGNOSIS — M2241 Chondromalacia patellae, right knee: Secondary | ICD-10-CM | POA: Diagnosis not present

## 2024-02-20 DIAGNOSIS — M2241 Chondromalacia patellae, right knee: Secondary | ICD-10-CM | POA: Diagnosis not present

## 2024-02-24 DIAGNOSIS — M2241 Chondromalacia patellae, right knee: Secondary | ICD-10-CM | POA: Diagnosis not present

## 2024-02-27 DIAGNOSIS — M2241 Chondromalacia patellae, right knee: Secondary | ICD-10-CM | POA: Diagnosis not present

## 2024-04-20 ENCOUNTER — Other Ambulatory Visit: Payer: Self-pay | Admitting: Internal Medicine

## 2024-04-20 DIAGNOSIS — Z1231 Encounter for screening mammogram for malignant neoplasm of breast: Secondary | ICD-10-CM

## 2024-05-26 ENCOUNTER — Ambulatory Visit

## 2024-06-02 ENCOUNTER — Ambulatory Visit
